# Patient Record
Sex: Female | Born: 1980 | Race: White | Hispanic: Yes | Marital: Married | Smoking: Never smoker
Health system: Southern US, Community
[De-identification: ages and names within clinical notes are randomized; demographics above are authoritative.]

---

## 2007-12-15 ENCOUNTER — Ambulatory Visit (HOSPITAL_COMMUNITY): Admission: RE | Admit: 2007-12-15 | Discharge: 2007-12-15 | Payer: Self-pay | Admitting: Obstetrics and Gynecology

## 2007-12-31 ENCOUNTER — Ambulatory Visit (HOSPITAL_COMMUNITY): Admission: RE | Admit: 2007-12-31 | Discharge: 2007-12-31 | Payer: Self-pay | Admitting: Obstetrics and Gynecology

## 2009-03-31 IMAGING — US US OB DETAIL+14 WK
1 series · 14 of 28 positions shown · non-contrast
Comparison: none

OBSTETRICAL ULTRASOUND:
 This ultrasound was performed in The [HOSPITAL], and the [facility] report will be stored to [REDACTED] PACS.

[Series 1: us ob detail+14 wk · 14 of 85 slices shown]
[im 4/85]
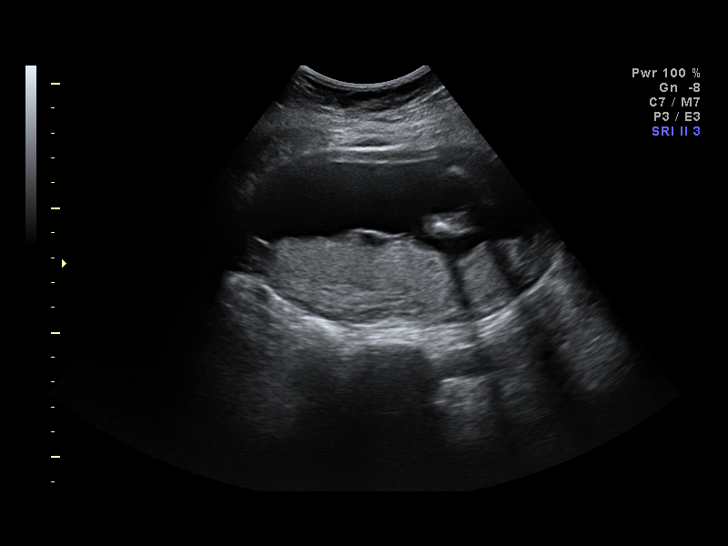
[im 10/85]
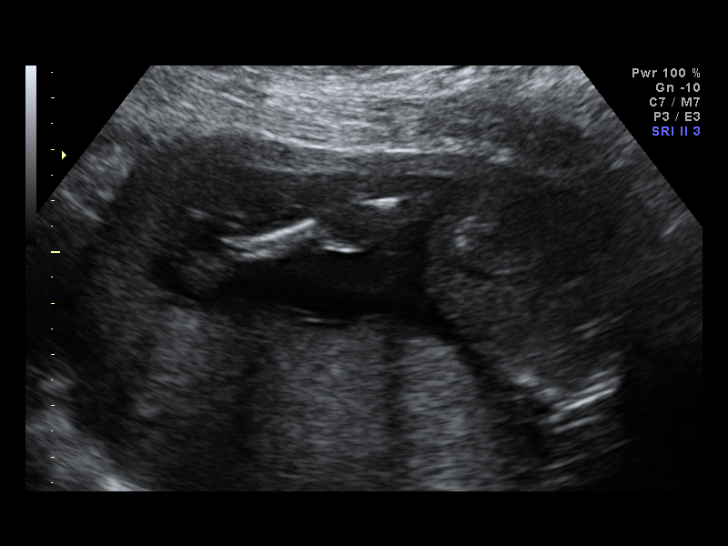
[im 16/85]
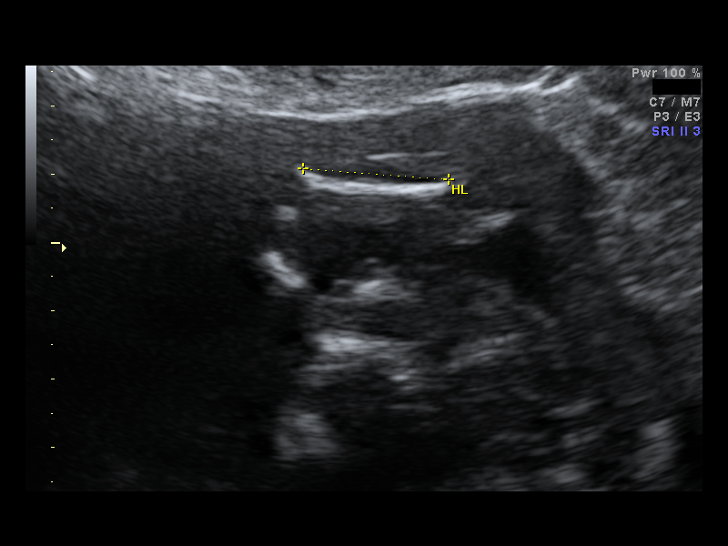
[im 22/85]
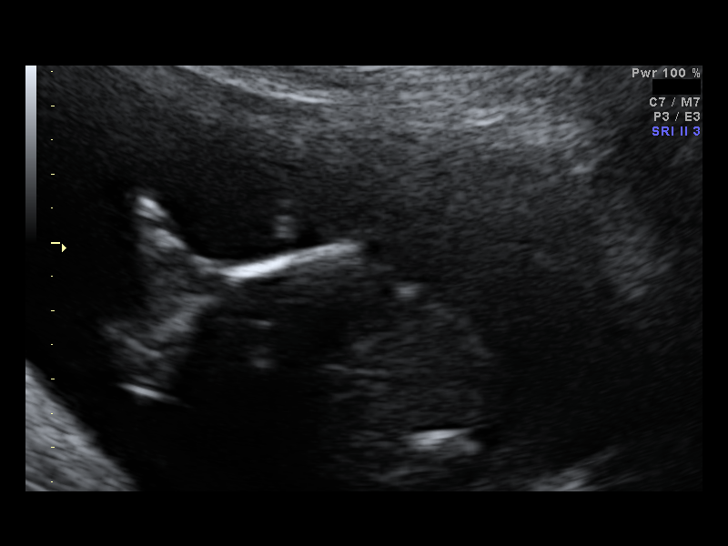
[im 29/85]
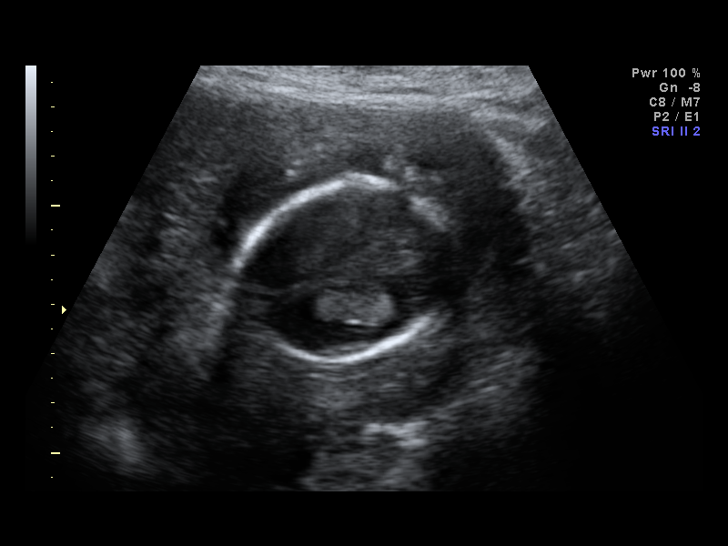
[im 35/85]
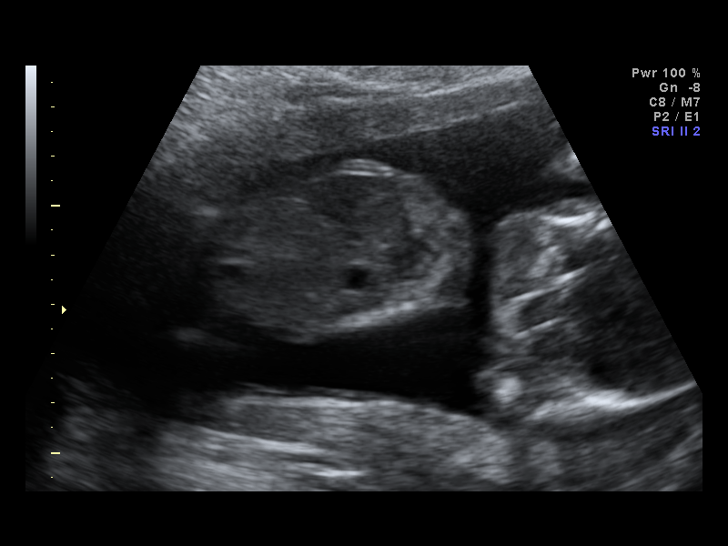
[im 41/85]
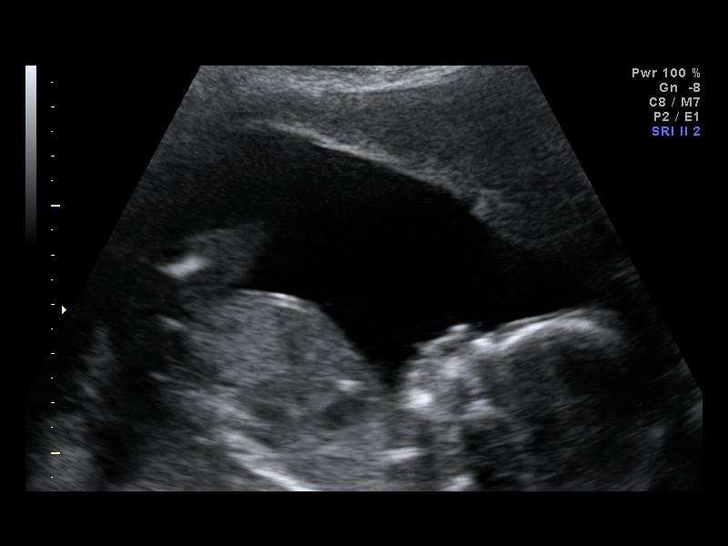
[im 47/85]
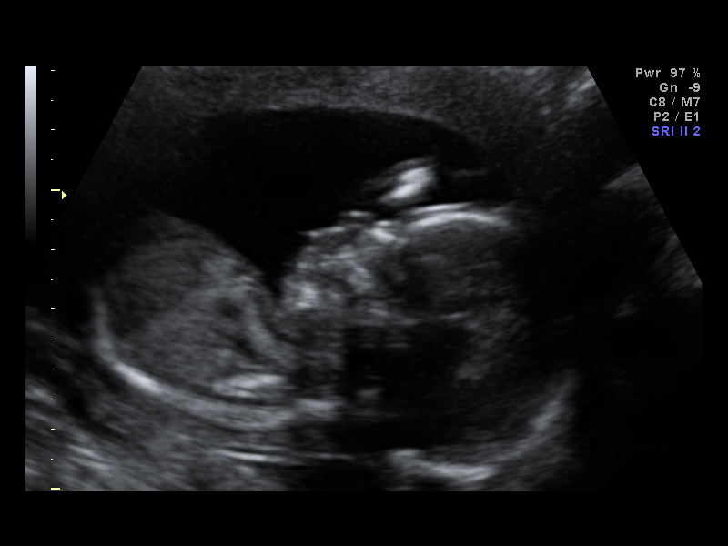
[im 53/85]
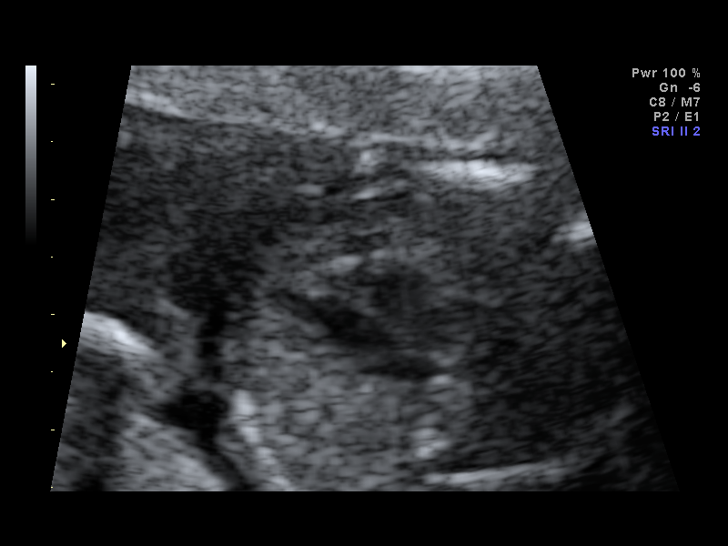
[im 60/85]
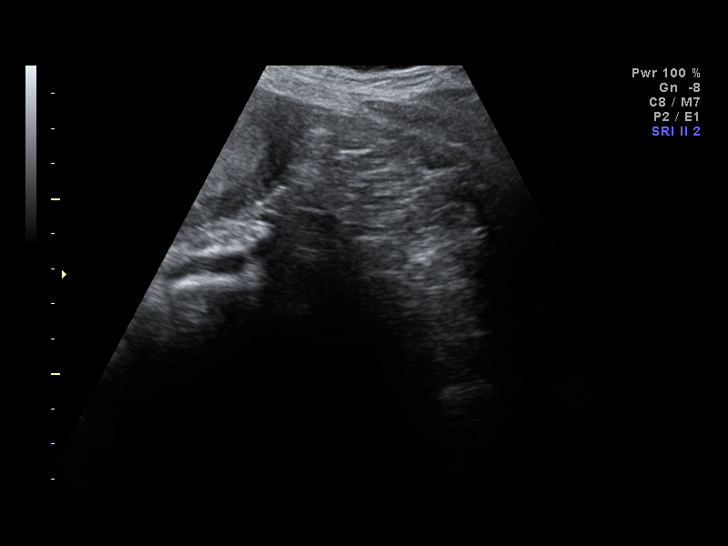
[im 66/85]
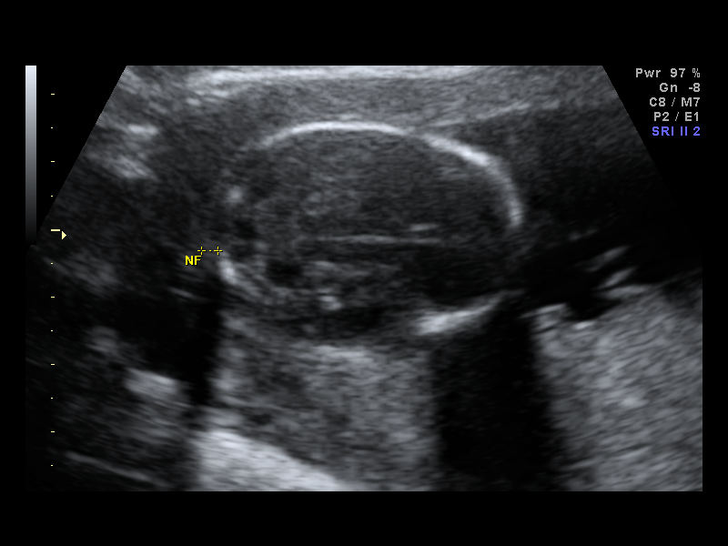
[im 72/85]
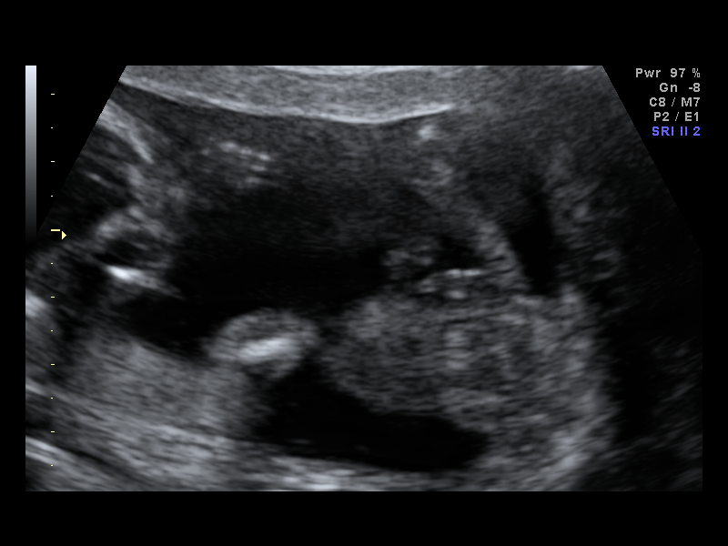
[im 78/85]
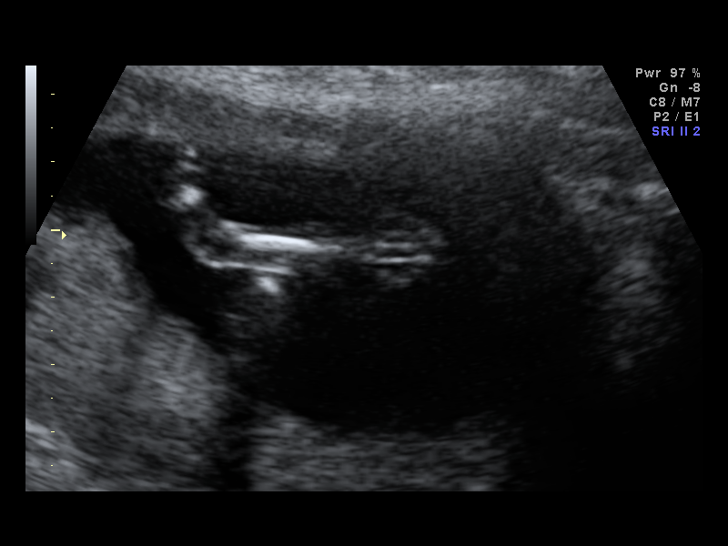
[im 85/85]
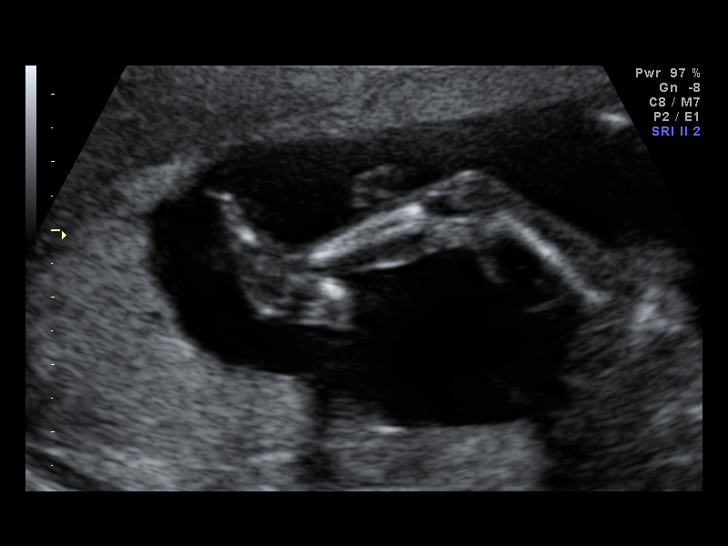

[14 of 28 positions shown; findings below may reference images not displayed]

IMPRESSION: [facility] has also been faxed to the ordering physician.

## 2010-08-19 ENCOUNTER — Encounter: Payer: Self-pay | Admitting: Obstetrics and Gynecology

## 2019-12-10 ENCOUNTER — Ambulatory Visit: Payer: Self-pay | Attending: Internal Medicine

## 2019-12-10 DIAGNOSIS — Z23 Encounter for immunization: Secondary | ICD-10-CM

## 2019-12-10 NOTE — Progress Notes (Signed)
   Covid-19 Vaccination Clinic  Name:  Sheryl Morales    MRN: [id number] DOB: [birth year]  12/10/2019  [name] was observed post Covid-19 immunization for 15 minutes without incident. She was provided with Vaccine Information Sheet and instruction to access the V-Safe system.   [name] was instructed to call 911 with any severe reactions post vaccine: Marland Kitchen Difficulty breathing  . Swelling of face and throat  . A fast heartbeat  . A bad rash all over body  . Dizziness and weakness   Immunizations Administered    Name Date Dose VIS Date Route   Pfizer COVID-19 Vaccine 12/10/2019 10:25 AM 0.3 mL 09/22/2018 Intramuscular   Manufacturer: [employer], [employer]   Lot: [id number]   NDC: [address]-3

## 2019-12-31 ENCOUNTER — Ambulatory Visit: Payer: Self-pay | Attending: Internal Medicine

## 2019-12-31 DIAGNOSIS — Z23 Encounter for immunization: Secondary | ICD-10-CM

## 2019-12-31 NOTE — Progress Notes (Signed)
   Covid-19 Vaccination Clinic  Name:  Michaiah Maiden    MRN: [id number] DOB: [birth year]  12/31/2019  [name] was observed post Covid-19 immunization for 15 minutes without incident. She was provided with Vaccine Information Sheet and instruction to access the V-Safe system.   Ms. Geurin was instructed to call 911 with any severe reactions post vaccine: Marland Kitchen Difficulty breathing  . Swelling of face and throat  . A fast heartbeat  . A bad rash all over body  . Dizziness and weakness   Immunizations Administered    Name Date Dose VIS Date Route   Pfizer COVID-19 Vaccine 12/31/2019  9:06 AM 0.3 mL 09/22/2018 Intramuscular   Manufacturer: [employer], [employer]   Lot: [id number]   NDC: [address]-7

## 2021-12-18 ENCOUNTER — Other Ambulatory Visit: Payer: Self-pay | Admitting: *Deleted

## 2021-12-18 DIAGNOSIS — Z1231 Encounter for screening mammogram for malignant neoplasm of breast: Secondary | ICD-10-CM

## 2022-03-11 ENCOUNTER — Ambulatory Visit
Admission: RE | Admit: 2022-03-11 | Discharge: 2022-03-11 | Disposition: A | Discharge status: Home / Self Care | Payer: No Typology Code available for payment source | Source: Ambulatory Visit | Attending: *Deleted | Admitting: *Deleted

## 2022-03-11 DIAGNOSIS — Z1231 Encounter for screening mammogram for malignant neoplasm of breast: Secondary | ICD-10-CM

## 2022-03-14 ENCOUNTER — Other Ambulatory Visit: Payer: Self-pay | Admitting: Obstetrics and Gynecology

## 2022-03-14 DIAGNOSIS — R928 Other abnormal and inconclusive findings on diagnostic imaging of breast: Secondary | ICD-10-CM

## 2022-03-26 ENCOUNTER — Other Ambulatory Visit (HOSPITAL_COMMUNITY): Payer: Self-pay | Admitting: Obstetrics and Gynecology

## 2022-03-26 DIAGNOSIS — R928 Other abnormal and inconclusive findings on diagnostic imaging of breast: Secondary | ICD-10-CM

## 2022-04-26 ENCOUNTER — Inpatient Hospital Stay: Payer: Self-pay | Attending: Obstetrics and Gynecology | Admitting: Hematology and Oncology

## 2022-04-26 VITALS — BP 119/62 | Wt 159.0 lb

## 2022-04-26 DIAGNOSIS — N6320 Unspecified lump in the left breast, unspecified quadrant: Secondary | ICD-10-CM

## 2022-04-26 NOTE — Progress Notes (Signed)
Sheryl Morales is a 41 y.o. female who presents to [facility] clinic today with complaint of left breast mass.    Pap Smear: Pap not smear completed today. Last Pap smear was 05/23 at [facility] clinic and was normal. Per patient has no history of an abnormal Pap smear. Last Pap smear result is not available in Epic.   Physical exam: Breasts Breasts symmetrical. No skin abnormalities bilateral breasts. No nipple retraction bilateral breasts. No nipple discharge bilateral breasts. No lymphadenopathy. No lumps palpated bilateral breasts.    MS DIGITAL SCREENING TOMO BILATERAL  Result Date: 03/13/2022 CLINICAL DATA:  Screening. EXAM: DIGITAL SCREENING BILATERAL MAMMOGRAM WITH TOMOSYNTHESIS AND CAD TECHNIQUE: Bilateral screening digital craniocaudal and mediolateral oblique mammograms were obtained. Bilateral screening digital breast tomosynthesis was performed. The images were evaluated with computer-aided detection. COMPARISON:  Previous exam(s). ACR Breast Density Category b: There are scattered areas of fibroglandular density. FINDINGS: In the left breast, a possible mass and adjacent possible subtle distortion warrants further evaluation. In the right breast, no findings suspicious for malignancy. IMPRESSION: Further evaluation is suggested for a possible mass and adjacent possible subtle distortion in the left breast. RECOMMENDATION: Diagnostic mammogram and possibly ultrasound of the left breast. (Code:[id number]) The patient will be contacted regarding the findings, and additional imaging will be scheduled. BI-RADS CATEGORY  0: Incomplete. Need additional imaging evaluation and/or prior mammograms for comparison. Electronically Signed   By: Evangeline Dakin M.D.   On: 03/13/2022 13:40       Pelvic/Bimanual Pap is not indicated today    Smoking History: Patient has never smoked and was not referred to quit line.    Patient Navigation: Patient education provided.  Access to services provided for patient through [place] interpreter provided. No transportation provided   Colorectal Cancer Screening: Per patient has never had colonoscopy completed No complaints today.    Breast and Cervical Cancer Risk Assessment: Patient does not have family history of breast cancer, known genetic mutations, or radiation treatment to the chest before age 41. Patient does not have history of cervical dysplasia, immunocompromised, or DES exposure in-utero.  Risk Assessment   No risk assessment data     A: [facility]CP exam without pap smear Complaint of possible left breast mass on screening mammogram. Benign exam.   P: [facility]. Appointment scheduled 04/30/22.  Dayton Scrape A, NP 04/26/2022 11:30 AM

## 2022-04-30 ENCOUNTER — Ambulatory Visit (HOSPITAL_COMMUNITY)
Admission: RE | Admit: 2022-04-30 | Discharge: 2022-04-30 | Disposition: A | Discharge status: Home / Self Care | Payer: Self-pay | Source: Ambulatory Visit | Attending: Obstetrics and Gynecology | Admitting: Obstetrics and Gynecology

## 2022-04-30 DIAGNOSIS — R928 Other abnormal and inconclusive findings on diagnostic imaging of breast: Secondary | ICD-10-CM | POA: Insufficient documentation

## 2023-02-20 ENCOUNTER — Other Ambulatory Visit (HOSPITAL_COMMUNITY): Payer: Self-pay | Admitting: Nurse Practitioner

## 2023-02-20 DIAGNOSIS — Z1231 Encounter for screening mammogram for malignant neoplasm of breast: Secondary | ICD-10-CM

## 2023-02-26 ENCOUNTER — Other Ambulatory Visit: Payer: Self-pay | Admitting: Nurse Practitioner

## 2023-02-26 DIAGNOSIS — Z1231 Encounter for screening mammogram for malignant neoplasm of breast: Secondary | ICD-10-CM

## 2023-05-05 ENCOUNTER — Ambulatory Visit (HOSPITAL_COMMUNITY): Payer: No Typology Code available for payment source

## 2023-05-05 ENCOUNTER — Ambulatory Visit (HOSPITAL_COMMUNITY)
Admission: RE | Admit: 2023-05-05 | Discharge: 2023-05-05 | Disposition: A | Discharge status: Home / Self Care | Payer: Self-pay | Source: Ambulatory Visit | Attending: Nurse Practitioner | Admitting: Nurse Practitioner

## 2023-05-05 DIAGNOSIS — Z1231 Encounter for screening mammogram for malignant neoplasm of breast: Secondary | ICD-10-CM | POA: Insufficient documentation

## 2023-09-29 ENCOUNTER — Telehealth: Payer: Self-pay

## 2023-09-29 NOTE — Telephone Encounter (Signed)
 Attempted wellness/follow up call to [facility] client. No answer, unable to leave message. Interpreter services utilized for call.  Next appointment at [facility] scheduled for 12/02/23 Last A1c in November 2024 was 6.0   Francee Nodal RN Clara  [employer]

## 2024-01-05 ENCOUNTER — Telehealth: Payer: Self-pay

## 2024-01-05 NOTE — Telephone Encounter (Signed)
 Attempted follow up call, with interpreter to [facility] client. No answer, left message.  Next appointment with [facility] is 06/02/24 Last A1c 6.1 on 12/02/23  Kris Pester RN [place] [facility]

## 2024-03-24 ENCOUNTER — Other Ambulatory Visit (HOSPITAL_COMMUNITY): Payer: Self-pay | Admitting: Nurse Practitioner

## 2024-03-24 DIAGNOSIS — Z1231 Encounter for screening mammogram for malignant neoplasm of breast: Secondary | ICD-10-CM

## 2024-05-05 ENCOUNTER — Ambulatory Visit (HOSPITAL_COMMUNITY)
Admission: RE | Admit: 2024-05-05 | Discharge: 2024-05-05 | Disposition: A | Discharge status: Home / Self Care | Payer: Self-pay | Source: Ambulatory Visit | Attending: Nurse Practitioner | Admitting: Nurse Practitioner

## 2024-05-05 ENCOUNTER — Encounter (HOSPITAL_COMMUNITY): Payer: Self-pay

## 2024-05-05 DIAGNOSIS — Z1231 Encounter for screening mammogram for malignant neoplasm of breast: Secondary | ICD-10-CM | POA: Diagnosis present

## 2024-05-11 ENCOUNTER — Other Ambulatory Visit (HOSPITAL_COMMUNITY): Payer: Self-pay | Admitting: Nurse Practitioner

## 2024-05-11 DIAGNOSIS — R928 Other abnormal and inconclusive findings on diagnostic imaging of breast: Secondary | ICD-10-CM

## 2024-05-13 ENCOUNTER — Encounter (HOSPITAL_COMMUNITY): Payer: Self-pay

## 2024-05-13 ENCOUNTER — Ambulatory Visit (HOSPITAL_COMMUNITY)
Admission: RE | Admit: 2024-05-13 | Discharge: 2024-05-13 | Disposition: A | Discharge status: Home / Self Care | Payer: Self-pay | Source: Ambulatory Visit | Attending: Nurse Practitioner | Admitting: Nurse Practitioner

## 2024-05-13 ENCOUNTER — Ambulatory Visit (HOSPITAL_COMMUNITY)
Admission: RE | Admit: 2024-05-13 | Discharge: 2024-05-13 | Disposition: A | Discharge status: Home / Self Care | Source: Ambulatory Visit | Attending: Nurse Practitioner | Admitting: Nurse Practitioner

## 2024-05-13 DIAGNOSIS — R928 Other abnormal and inconclusive findings on diagnostic imaging of breast: Secondary | ICD-10-CM

## 2024-06-20 LAB — COLOGUARD: COLOGUARD: NEGATIVE
